# Patient Record
Sex: Female | Born: 1975 | Race: White | Hispanic: No
Health system: Southern US, Community
[De-identification: ages and names within clinical notes are randomized; demographics above are authoritative.]

---

## 2011-07-08 ENCOUNTER — Other Ambulatory Visit: Payer: Self-pay | Admitting: Family Medicine

## 2020-10-24 ENCOUNTER — Ambulatory Visit (INDEPENDENT_AMBULATORY_CARE_PROVIDER_SITE_OTHER): Admitting: Sports Medicine

## 2020-10-24 ENCOUNTER — Telehealth: Payer: Self-pay

## 2020-10-24 DIAGNOSIS — S8991XS Unspecified injury of right lower leg, sequela: Secondary | ICD-10-CM | POA: Diagnosis not present

## 2020-10-24 DIAGNOSIS — M25561 Pain in right knee: Secondary | ICD-10-CM | POA: Diagnosis not present

## 2020-10-24 DIAGNOSIS — S8991XA Unspecified injury of right lower leg, initial encounter: Secondary | ICD-10-CM | POA: Insufficient documentation

## 2020-10-24 NOTE — Progress Notes (Signed)
    Procedures performed today:    None.  Independent interpretation of notes and tests performed by another provider:   None.  Brief History, Exam, Impression, and Recommendations:    Right knee injury This is a pleasant 45 year old female, she is a chronic pain patient. January of this year she had a fall at work, she had what sounds to be hematoma around her pes anserine bursa, and pain at the medial joint line. She was seen by her PCP, x-rays were done that were negative for fracture, ultimately she was referred to her pain provider, a week ago she had an injection into her pes bursa. Unfortunately nothing is helping her symptoms, she has been finding the Department of Labor to get an MRI and is referred to me for further evaluation and definitive treatment. On exam she does not have an effusion, she still has some bruising and a palpable subcutaneous nodule consistent with hematoma at the pes anserine bursa, dominant finding is tenderness at the medial joint line. Good motion, good strength, negative McMurray's sign, ligamentous structures intact. Considering chronic and persistent medial joint line pain I am suspicious for a degenerative meniscal tear. She declines injection today, we will proceed with MRI. She already has an approval from a prior injury for seated duty only so we will update a work note.    ___________________________________________ Ihor Austin. Benjamin Stain, M.D., ABFM., CAQSM. Primary Care and Sports Medicine [facility]  Adjunct Instructor of Family Medicine  [employer]

## 2020-10-24 NOTE — Telephone Encounter (Signed)
I called and left a message asking patient to call back with a workers comp phone number.

## 2020-10-24 NOTE — Assessment & Plan Note (Signed)
This is a pleasant 45 year old female, she is a chronic pain patient. January of this year she had a fall at work, she had what sounds to be hematoma around her pes anserine bursa, and pain at the medial joint line. She was seen by her PCP, x-rays were done that were negative for fracture, ultimately she was referred to her pain provider, a week ago she had an injection into her pes bursa. Unfortunately nothing is helping her symptoms, she has been finding the Department of Labor to get an MRI and is referred to me for further evaluation and definitive treatment. On exam she does not have an effusion, she still has some bruising and a palpable subcutaneous nodule consistent with hematoma at the pes anserine bursa, dominant finding is tenderness at the medial joint line. Good motion, good strength, negative McMurray's sign, ligamentous structures intact. Considering chronic and persistent medial joint line pain I am suspicious for a degenerative meniscal tear. She declines injection today, we will proceed with MRI. She already has an approval from a prior injury for seated duty only so we will update a work note.

## 2020-11-23 ENCOUNTER — Ambulatory Visit (INDEPENDENT_AMBULATORY_CARE_PROVIDER_SITE_OTHER)

## 2020-11-23 ENCOUNTER — Other Ambulatory Visit: Payer: Self-pay

## 2020-11-23 ENCOUNTER — Ambulatory Visit (INDEPENDENT_AMBULATORY_CARE_PROVIDER_SITE_OTHER): Admitting: Sports Medicine

## 2020-11-23 DIAGNOSIS — S8991XS Unspecified injury of right lower leg, sequela: Secondary | ICD-10-CM | POA: Diagnosis not present

## 2020-11-23 DIAGNOSIS — S8991XD Unspecified injury of right lower leg, subsequent encounter: Secondary | ICD-10-CM | POA: Diagnosis not present

## 2020-11-23 NOTE — Assessment & Plan Note (Addendum)
Please see prior assessment and plan for further details, she continues to have pain, and we are waiting on the Department of Labor to approve her MRI, I did write a narrative today, and we did an injection into her right knee to help her with pain until we can get the MRI approved. Return to see me for MRI results.  Today we also filled out her CA 17 form and wrote a narrative for the Department of Labor.

## 2020-11-23 NOTE — Progress Notes (Signed)
    Procedures performed today:    Procedure: Real-time Ultrasound Guided injection of the right knee Device: Samsung HS60  Verbal informed consent obtained.  Time-out conducted.  Noted no overlying erythema, induration, or other signs of local infection.  Skin prepped in a sterile fashion.  Local anesthesia: Topical Ethyl chloride.  With sterile technique and under real time ultrasound guidance: Noted only trace effusion, 1 cc Kenalog 40, 2 cc lidocaine, 2 cc bupivacaine injected easily Completed without difficulty  Advised to call if fevers/chills, erythema, induration, drainage, or persistent bleeding.  Images permanently stored and available for review in PACS.  Impression: Technically successful ultrasound guided injection.  Independent interpretation of notes and tests performed by another provider:   None.  Brief History, Exam, Impression, and Recommendations:    Right knee injury Please see prior assessment and plan for further details, she continues to have pain, and we are waiting on the Department of Labor to approve her MRI, I did write a narrative today, and we did an injection into her right knee to help her with pain until we can get the MRI approved. Return to see me for MRI results.  Today we also filled out her CA 17 form and wrote a narrative for the Department of Labor.    ___________________________________________ Ihor Austin. Benjamin Stain, M.D., ABFM., CAQSM. Primary Care and Sports Medicine [facility]  Adjunct Instructor of Family Medicine  [employer]

## 2020-12-11 ENCOUNTER — Telehealth: Payer: Self-pay

## 2020-12-11 NOTE — Telephone Encounter (Signed)
T called stating that her case worker with the Dept of Labor is saying that the letter you wrote on 11/23/20 does not say that the injuries came from the fall she sustained at work.

## 2020-12-12 NOTE — Telephone Encounter (Signed)
I have updated her letter to indicate that the injuries sustained are directly related to the fall.

## 2020-12-12 NOTE — Telephone Encounter (Signed)
It says that she sustained a fall while at work.  Do I need to change it?

## 2020-12-12 NOTE — Telephone Encounter (Signed)
Yes, what she stated was that the case worker said the letter doesn't state that the injuries came from the fall.

## 2020-12-13 NOTE — Telephone Encounter (Signed)
Patient aware letter has been updated and available through mychart.

## 2021-01-03 ENCOUNTER — Ambulatory Visit (INDEPENDENT_AMBULATORY_CARE_PROVIDER_SITE_OTHER): Payer: Self-pay | Admitting: Sports Medicine

## 2021-01-03 ENCOUNTER — Other Ambulatory Visit: Payer: Self-pay

## 2021-01-03 DIAGNOSIS — S8991XS Unspecified injury of right lower leg, sequela: Secondary | ICD-10-CM

## 2021-01-03 NOTE — Assessment & Plan Note (Addendum)
Tina Bradshaw returns, we did a knee injection into her right knee, she returns today endorsing no relief, we are proceeding with MRI which was ordered at a previous visit. Updating work note. Return to see me to go over MRI results.

## 2021-01-03 NOTE — Progress Notes (Signed)
    Procedures performed today:    None.  Independent interpretation of notes and tests performed by another provider:   None.  Brief History, Exam, Impression, and Recommendations:    Right knee injury Mekenzie returns, we did a knee injection into her right knee, she returns today endorsing no relief, we are proceeding with MRI which was ordered at a previous visit. Updating work note. Return to see me to go over MRI results.    ___________________________________________ Ihor Austin. Benjamin Stain, M.D., ABFM., CAQSM. Primary Care and Sports Medicine [facility]  Adjunct Instructor of Family Medicine  [employer]

## 2021-02-26 ENCOUNTER — Other Ambulatory Visit: Payer: Self-pay

## 2021-02-26 ENCOUNTER — Ambulatory Visit (INDEPENDENT_AMBULATORY_CARE_PROVIDER_SITE_OTHER)

## 2021-02-26 DIAGNOSIS — M25461 Effusion, right knee: Secondary | ICD-10-CM | POA: Diagnosis not present

## 2021-02-26 DIAGNOSIS — M25561 Pain in right knee: Secondary | ICD-10-CM

## 2021-02-26 DIAGNOSIS — G8929 Other chronic pain: Secondary | ICD-10-CM

## 2021-02-26 DIAGNOSIS — S8991XS Unspecified injury of right lower leg, sequela: Secondary | ICD-10-CM

## 2023-04-14 IMAGING — MR MR KNEE*R* W/O CM
7 of 8 series · 32 of 40 positions shown · non-contrast
Comparison: X-ray knee 03/09/2014.

CLINICAL DATA: Patient complains of right medial joint line knee
pain with swelling since 06/03/2020. Patient reports she slipped on
ice and fell. Patient reports previous injection 1 month ago without
relief. Patient denies history of surgery or cancer. Assess for
meniscal tear versus osteochondral injury.

EXAM:
MRI OF THE RIGHT KNEE WITHOUT CONTRAST
TECHNIQUE: Multiplanar, multisequence MR imaging of the knee was performed. No
intravenous contrast was administered.

[Series 3: T2 fat-sat · axial · 4.0mm · 0.50mm/px · z∈[-57,+88]mm · 6 of 30 slices shown (1 of 3)]
[im 1/30]
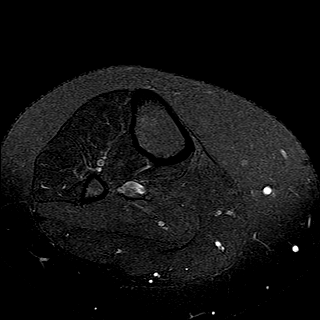
[im 6/30]
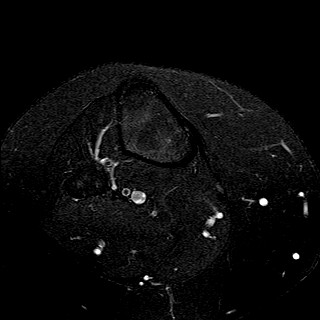
[im 12/30]
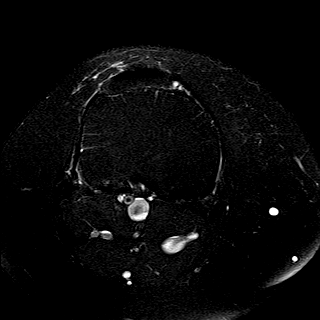
[im 18/30]
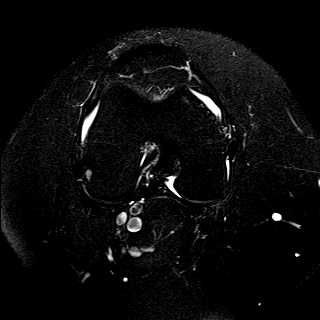
[im 24/30]
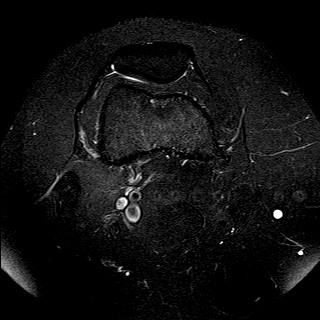
[im 30/30]
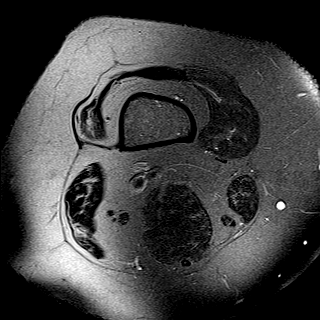

[Series 5: T2 fat-sat · coronal · 4.0mm · 0.59mm/px · 5 of 26 slices shown (2 of 3)]
[im 1/26]
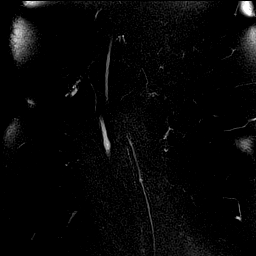
[im 7/26]
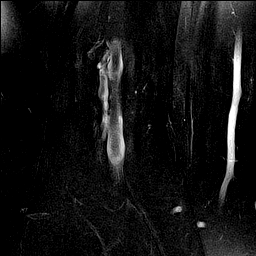
[im 13/26]
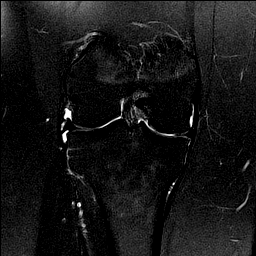
[im 19/26]
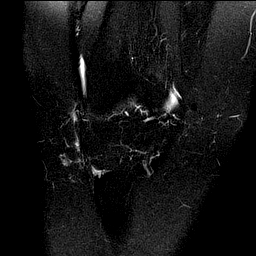
[im 26/26]
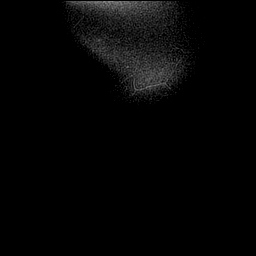

[Series 6: PD fat-sat · coronal · 4.0mm · 0.59mm/px · 5 of 26 slices shown (1 of 4)]
[im 1/26]
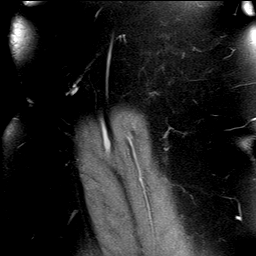
[im 7/26]
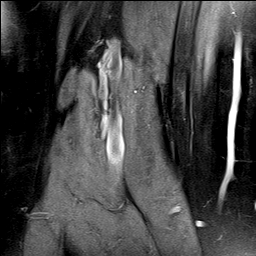
[im 13/26]
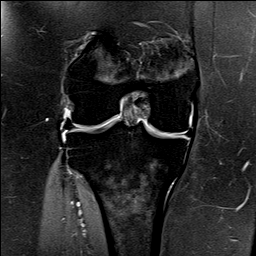
[im 19/26]
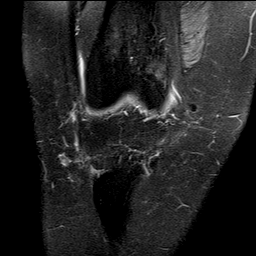
[im 26/26]
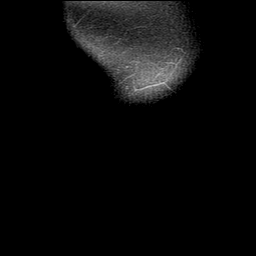

[Series 7: PD fat-sat · sagittal · 3.0mm · 0.29mm/px · 5 of 28 slices shown (2 of 4)]
[im 1/28]
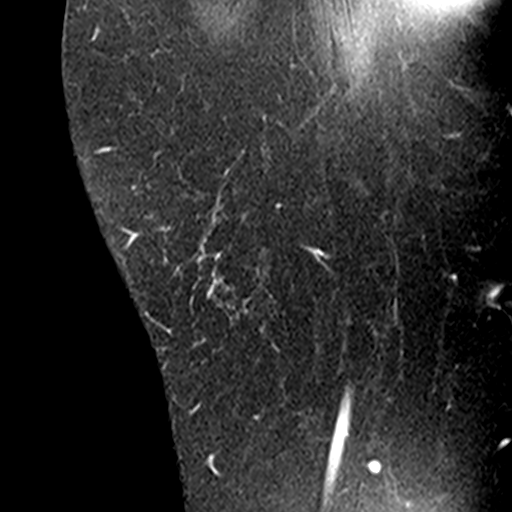
[im 7/28]
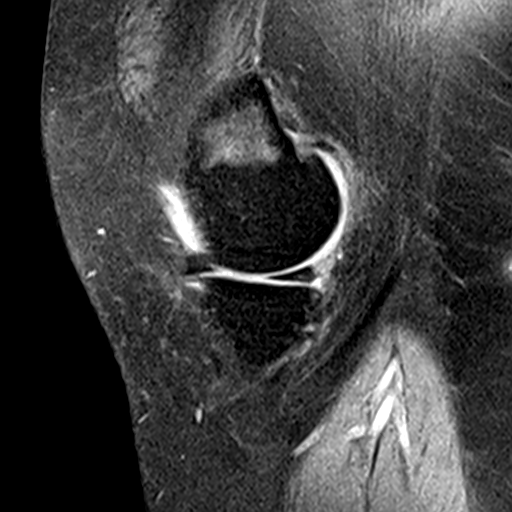
[im 14/28]
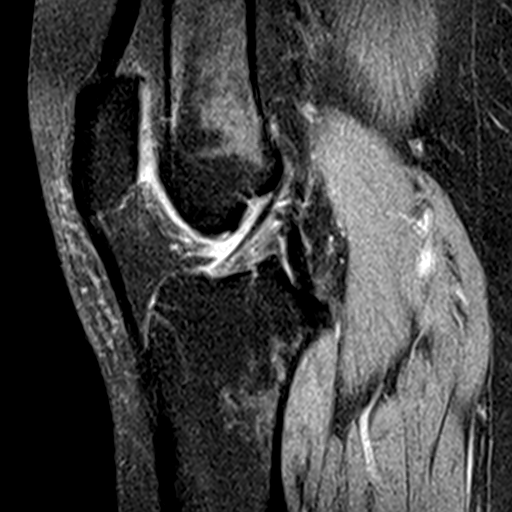
[im 21/28]
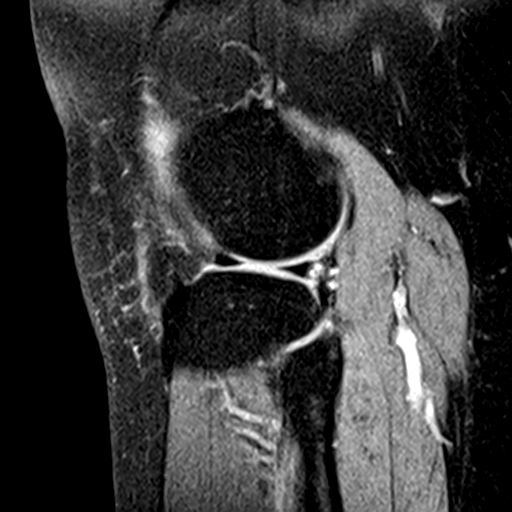
[im 28/28]
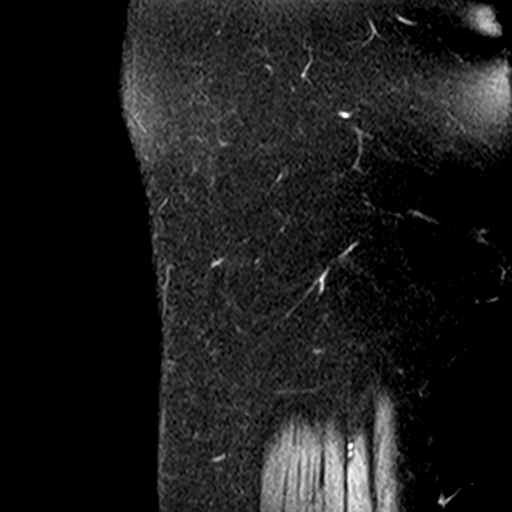

[Series 8: T2 fat-sat · sagittal · 3.0mm · 0.29mm/px · 2 of 28 slices shown (3 of 3)]
[im 1/28]
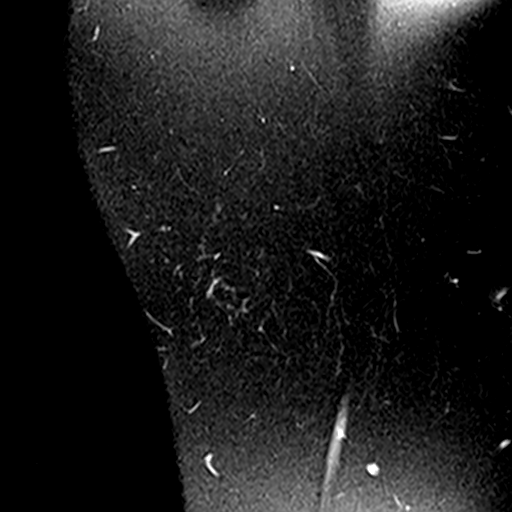
[im 7/28]
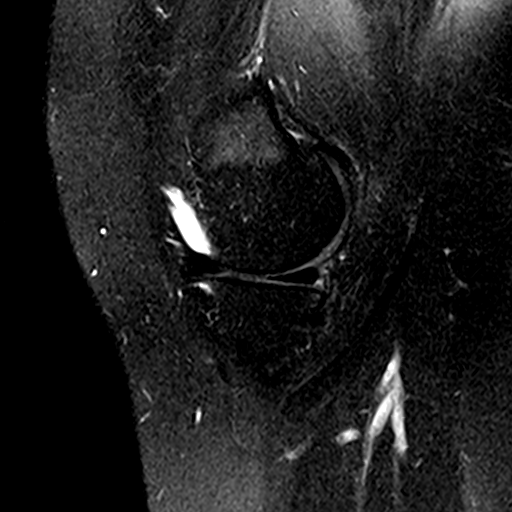

[Series 9: PD fat-sat · coronal · 2.0mm · 0.59mm/px · 4 of 21 slices shown (3 of 4)]
[im 1/21]
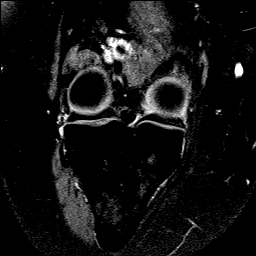
[im 7/21]
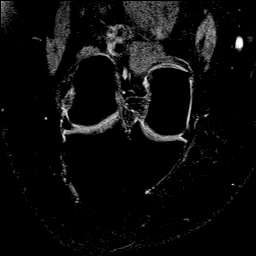
[im 14/21]
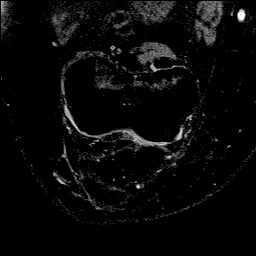
[im 21/21]
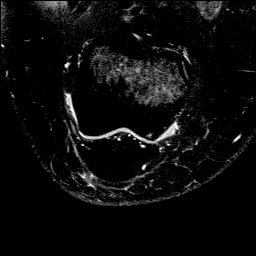

[Series 10: PD fat-sat · sagittal · 3.0mm · 0.29mm/px · 5 of 28 slices shown (4 of 4)]
[im 1/28]
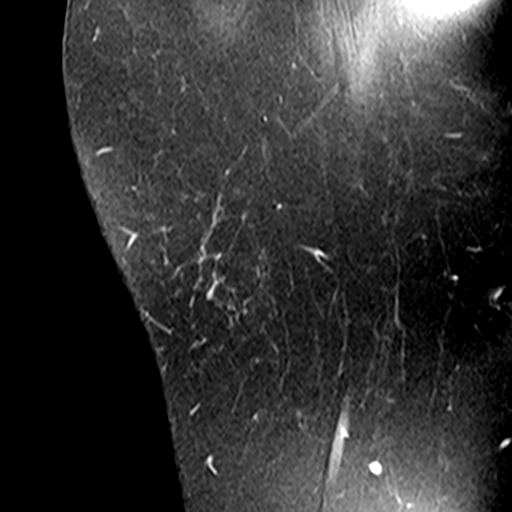
[im 7/28]
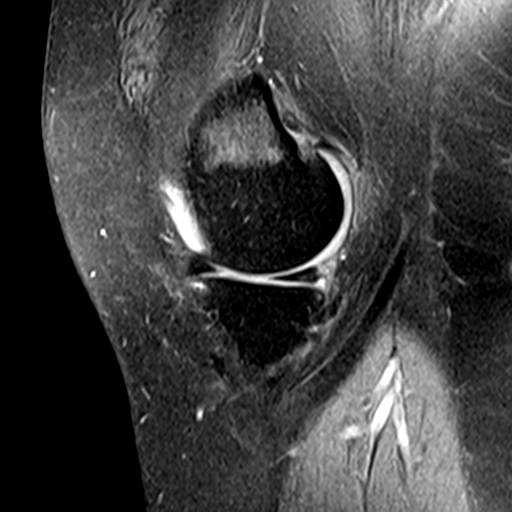
[im 14/28]
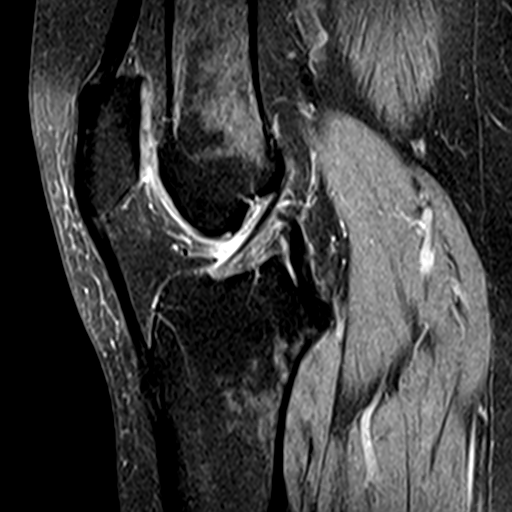
[im 21/28]
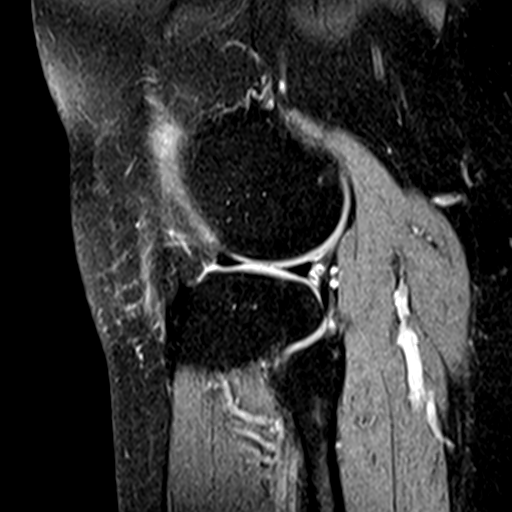
[im 28/28]
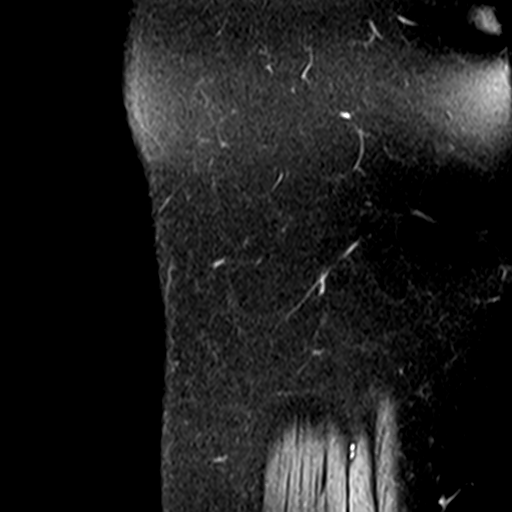

[32 of 40 positions shown; findings below may reference images not displayed]

FINDINGS: MENISCI

Medial meniscus:  Intact.

Lateral meniscus: Intact.

LIGAMENTS

Cruciates: Intact ACL and PCL.

Collaterals: Medial collateral ligament is intact. Lateral
collateral ligament complex is intact.

CARTILAGE

Patellofemoral: Small area of focal partial-thickness chondral loss
at the superior aspect of the central trochlear groove (series 3,
image 11). Mild chondral thinning at the patellar apex.

Medial: Mild chondral thinning of the weight-bearing medial
compartment.

Lateral: No chondral defect.

Joint:  Trace joint effusion.  Fat pads within normal limits.

Popliteal Fossa: No Baker's cyst.  Intact popliteus tendon.

Extensor Mechanism: Intact quadriceps tendon and patellofemoral
tendon.

Bones: No focal marow signal abnormality. No fracture or
dislocation.

Other: None.
IMPRESSION: 1. No evidence of internal derangement of the right knee.
2. Mild medial and patellofemoral compartment osteoarthritis.
3. Trace joint effusion.
